# Patient Record
Sex: Male | Born: 2000 | Race: Black or African American | Hispanic: No | Marital: Single
Health system: Midwestern US, Community
[De-identification: ages and names within clinical notes are randomized; demographics above are authoritative.]

## PROBLEM LIST (undated history)

## (undated) DIAGNOSIS — R4689 Other symptoms and signs involving appearance and behavior: Secondary | ICD-10-CM

## (undated) DIAGNOSIS — E669 Obesity, unspecified: Secondary | ICD-10-CM

## (undated) DIAGNOSIS — M2142 Flat foot [pes planus] (acquired), left foot: Secondary | ICD-10-CM

## (undated) DIAGNOSIS — F419 Anxiety disorder, unspecified: Secondary | ICD-10-CM

## (undated) DIAGNOSIS — H5210 Myopia, unspecified eye: Secondary | ICD-10-CM

## (undated) DIAGNOSIS — N3944 Nocturnal enuresis: Secondary | ICD-10-CM

## (undated) DIAGNOSIS — R7303 Prediabetes: Secondary | ICD-10-CM

## (undated) DIAGNOSIS — T148XXA Other injury of unspecified body region, initial encounter: Secondary | ICD-10-CM

## (undated) DIAGNOSIS — J309 Allergic rhinitis, unspecified: Secondary | ICD-10-CM

## (undated) DIAGNOSIS — M2141 Flat foot [pes planus] (acquired), right foot: Secondary | ICD-10-CM

## (undated) DIAGNOSIS — S92403A Displaced unspecified fracture of unspecified great toe, initial encounter for closed fracture: Secondary | ICD-10-CM

## (undated) HISTORY — DX: Flat foot (pes planus) (acquired), right foot: M21.41

## (undated) HISTORY — DX: Prediabetes: R73.03

## (undated) HISTORY — DX: Nocturnal enuresis: N39.44

## (undated) HISTORY — DX: Myopia, unspecified eye: H52.10

## (undated) HISTORY — DX: Allergic rhinitis, unspecified: J30.9

## (undated) HISTORY — DX: Flat foot (pes planus) (acquired), left foot: M21.42

## (undated) HISTORY — DX: Other injury of unspecified body region, initial encounter: T14.8XXA

## (undated) HISTORY — DX: Displaced unspecified fracture of unspecified great toe, initial encounter for closed fracture: S92.403A

## (undated) HISTORY — DX: Obesity, unspecified: E66.9

## (undated) HISTORY — DX: Other symptoms and signs involving appearance and behavior: R46.89

---
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
## 2019-07-03 ENCOUNTER — Other Ambulatory Visit: Payer: Self-pay

## 2019-07-03 DIAGNOSIS — Z20822 Contact with and (suspected) exposure to covid-19: Secondary | ICD-10-CM

## 2019-07-06 LAB — NOVEL CORONAVIRUS, NAA: SARS-CoV-2, NAA: NOT DETECTED

## 2019-08-07 ENCOUNTER — Other Ambulatory Visit: Payer: Self-pay

## 2019-08-07 DIAGNOSIS — Z20822 Contact with and (suspected) exposure to covid-19: Secondary | ICD-10-CM

## 2019-08-08 LAB — NOVEL CORONAVIRUS, NAA: SARS-CoV-2, NAA: DETECTED — AB

## 2019-08-09 ENCOUNTER — Telehealth: Payer: Self-pay | Admitting: Family Medicine

## 2019-08-09 NOTE — Telephone Encounter (Signed)
Patient called about Positive Covid test from 08/07/2019 testing event. The patient has mild upper respiratory symptoms and body aches . Symptoms are mild at this time. Patient understands the needs to stay in isolation for a total of 10 days from onset of symptom or 14 days total from date of testing if no symptom. Patient knows the health department may be in touch.  I answered all of patient's questions and all concerns addressed.   Nolon Nations  APRN, MSN, FNP-C [facility] [address] [place], [place] [address] [phone]

## 2019-08-14 ENCOUNTER — Other Ambulatory Visit: Payer: Self-pay

## 2019-08-14 DIAGNOSIS — Z20822 Contact with and (suspected) exposure to covid-19: Secondary | ICD-10-CM

## 2019-08-15 LAB — NOVEL CORONAVIRUS, NAA: SARS-CoV-2, NAA: DETECTED — AB

## 2019-08-23 ENCOUNTER — Other Ambulatory Visit: Payer: Self-pay

## 2019-08-23 DIAGNOSIS — Z20822 Contact with and (suspected) exposure to covid-19: Secondary | ICD-10-CM

## 2019-08-25 LAB — NOVEL CORONAVIRUS, NAA: SARS-CoV-2, NAA: NOT DETECTED

## 2020-03-29 ENCOUNTER — Encounter (HOSPITAL_COMMUNITY): Payer: Self-pay | Admitting: *Deleted

## 2020-03-29 ENCOUNTER — Ambulatory Visit (HOSPITAL_COMMUNITY)
Admission: EM | Admit: 2020-03-29 | Discharge: 2020-03-29 | Disposition: A | Discharge status: Home / Self Care | Payer: No Typology Code available for payment source | Attending: Urgent Care | Admitting: Urgent Care

## 2020-03-29 ENCOUNTER — Other Ambulatory Visit: Payer: Self-pay

## 2020-03-29 DIAGNOSIS — R739 Hyperglycemia, unspecified: Secondary | ICD-10-CM | POA: Diagnosis not present

## 2020-03-29 DIAGNOSIS — Z87898 Personal history of other specified conditions: Secondary | ICD-10-CM

## 2020-03-29 DIAGNOSIS — Z833 Family history of diabetes mellitus: Secondary | ICD-10-CM

## 2020-03-29 DIAGNOSIS — R358 Other polyuria: Secondary | ICD-10-CM | POA: Diagnosis not present

## 2020-03-29 LAB — CBG MONITORING, ED: Glucose-Capillary: 305 mg/dL — ABNORMAL HIGH (ref 70–99)

## 2020-03-29 NOTE — Discharge Instructions (Addendum)
For diabetes or elevated blood sugar, please make sure you are avoiding starchy, carbohydrate foods like pasta, breads, pastry, rice, potatoes, desserts, tortillas, wraps, cereals. These foods can elevated your blood sugar. Also, avoid sodas, sweet teas, sugary beverages, fruit juices.  Drinking plain water will be much more helpful, try 64 ounces of water daily.  It is okay to flavor your water naturally by cutting cucumber, lemon, mint or lime, placing it in a picture with water and drinking it over a period of 2 to 3 days as long as it remains refrigerated.    For elevated blood pressure, make sure you are monitoring salt in your diet.  Do not eat restaurant foods and limit processed foods at home, prepare/cook your own foods at home.  Processed foods include things like frozen meals preseasoned meats and dinners, deli meats, canned foods as they are high in sodium/salt.  Make sure your pain attention to sodium labels on foods you by at the grocery store.  For seasoning you can use a brand called Mrs. Dash which includes a lot of salt free seasonings.  Salads - kale, spinach, cabbage, spring mix; use seeds like pumpkin seeds or sunflower seeds, almonds, walnuts or pecans; you can also use 1-2 hard boiled eggs in your salads Fruits - avocadoes, berries (blueberries, raspberries, blackberries), apples, oranges, pomegranate, pear; avoid eating bananas, grapes regularly Vegetables - aspargus, cauliflower, broccoli, green beans, brussel spouts, bell peppers; stay away from starchy vegetables like potatoes, carrots, peas  Regarding meat it is better to eat lean meats and limit your red meat including pork to once a week.  Wild caught fish, chicken breast are good options as they tend to be leaner sources of good protein. Pay attention to the labels that document how much sodium/salt is in your meat serving and limit this when possible.   DO NOT EAT ANY FOODS ON THIS LIST THAT YOU ARE ALLERGIC TO.

## 2020-03-29 NOTE — ED Provider Notes (Signed)
[facility]   MRN: [id number] DOB: 02/21/2001  Subjective:   Alexander Valdez is a 19 y.o. male presenting for blood sugar check.  This was done at work by the nursing site.  Patient has had abdominal bloating, urinary frequency and general fatigue, increased thirst since last Friday. Family hx of diabetes of type 2. No type 1.  Denies confusion, headache, chest pain, shortness of breath, belly pain, hematuria.  Has a history of prediabetes.  Has never had to take medication for high blood sugar.  No current facility-administered medications for this encounter.  Current Outpatient Medications:  .  cetirizine (ZYRTEC) 10 MG tablet, TAKE ONE (1) TABLET BY MOUTH EVERY DAY, Disp: 30 tablet, Rfl: 5 .  FLUoxetine (PROZAC) 20 MG capsule, Take 2 capsules (40 mg total) by mouth daily., Disp: 60 capsule, Rfl: 0 .  fluticasone (FLONASE) 50 MCG/ACT nasal spray, PLACE 2 SPRAYS INTO BOTH NOSTRILS DAILY, Disp: 16 g, Rfl: 5 .  albuterol (PROVENTIL HFA;VENTOLIN HFA) 108 (90 Base) MCG/ACT inhaler, Inhale 2 puffs into the lungs every 4 (four) hours as needed for wheezing (or cough)., Disp: 1 Inhaler, Rfl: 0 .  Multiple Vitamin (MULTIVITAMIN WITH MINERALS) TABS tablet, Take 1 tablet by mouth daily., Disp: , Rfl:    Allergies  Allergen Reactions  . Shellfish Allergy     Past Medical History:  Diagnosis Date  . 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 Allergic rhinitis 19 yr old onset  . Anxiety   . Asthma 2007   2014 mild, intermittant, last Qvar 2011, last wheeze in clinic 2010, first wheeze at 19 year old  . Fracture of bone 05/2008   avulsion fracture left elbow  . Fractured great toe 12/2008   right phalanx  . Myopia   . Obesity 2012  . Pes planus (flat feet) 2010 dxn   10/2011: saw Dr. Charlett Blake, had orthotic  . Pre-diabetes 03/2012   HgA1C 5.9 2013  . Primary nocturnal enuresis 2012     History reviewed. No pertinent surgical  history.  Family History  Problem Relation Age of Onset  . Obesity Mother   . Depression Mother   . Obesity Sister     Social History   Tobacco Use  . Smoking status: Never Smoker  . Smokeless tobacco: Never Used  Substance Use Topics  . Alcohol use: No  . Drug use: No    ROS   Objective:   Vitals: Pulse 79   Temp 98.9 F (37.2 C) (Oral)   Resp 17   SpO2 100%   Physical Exam Constitutional:      General: He is not in acute distress.    Appearance: Normal appearance. He is well-developed. He is obese. He is not ill-appearing, toxic-appearing or diaphoretic.  HENT:     Head: Normocephalic and atraumatic.     Right Ear: External ear normal.     Left Ear: External ear normal.     Nose: Nose normal.     Mouth/Throat:     Mouth: Mucous membranes are moist.     Pharynx: Oropharynx is clear.  Eyes:     General: No scleral icterus.    Extraocular Movements: Extraocular movements intact.     Pupils: Pupils are equal, round, and reactive to light.  Cardiovascular:     Rate and Rhythm: Normal rate and regular rhythm.     Heart sounds: Normal heart sounds. No murmur heard.  No friction rub. No  gallop.   Pulmonary:     Effort: Pulmonary effort is normal. No respiratory distress.     Breath sounds: Normal breath sounds. No stridor. No wheezing, rhonchi or rales.  Neurological:     Mental Status: He is alert and oriented to person, place, and time.  Psychiatric:        Mood and Affect: Mood normal.        Behavior: Behavior normal.        Thought Content: Thought content normal.     Results for orders placed or performed during the hospital encounter of 03/29/20 (from the past 24 hour(s))  POC CBG monitoring     Status: Abnormal   Collection Time: 03/29/20  4:12 PM  Result Value Ref Range   Glucose-Capillary 305 (H) 70 - 99 mg/dL   Comment 1 Document in Chart     Assessment and Plan :   PDMP not reviewed this encounter.  1. Hyperglycemia   2. History of  prediabetes   3. Family history of diabetes mellitus     Patient states that he does not want to take medication at this time.  Would like to make dietary modifications.  Does not have a PCP established.  Was previously seen by his pediatrician but has not seen them since he became an adult.  Recommended establishing care with [facility] for new PCP and recheck on his blood sugar.  In the meantime reviewed diabetic friendly diet. Counseled patient on potential for adverse effects with medications prescribed/recommended today, ER and return-to-clinic precautions discussed, patient verbalized understanding.    Wallis Bamberg, [place] 03/29/20 1644

## 2020-03-29 NOTE — ED Triage Notes (Addendum)
Patient reports polyuria and bloating since Friday. No history of diabetes. Reports increased thirst. Reports fatigue. Denies dizziness.   States the RN at work did CBG on site and was HIGH.

## 2020-03-30 ENCOUNTER — Telehealth: Payer: Self-pay

## 2020-03-30 NOTE — Telephone Encounter (Signed)
Received call from mom that pt had episode of hyperglycemia yesterday, went to urgent care, BG was in 400s, they got it to come down to 300s, it is in 300s again this AM, pt went to work but she would like him to be seen ASAP to address matter. 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 Patient needs to transition to adult PCP soon.

## 2020-03-31 ENCOUNTER — Ambulatory Visit: Payer: Self-pay | Admitting: Pediatrics

## 2020-03-31 ENCOUNTER — Other Ambulatory Visit: Payer: Self-pay

## 2020-03-31 ENCOUNTER — Other Ambulatory Visit: Payer: No Typology Code available for payment source

## 2020-03-31 VITALS — Wt 353.2 lb

## 2020-03-31 DIAGNOSIS — Z20822 Contact with and (suspected) exposure to covid-19: Secondary | ICD-10-CM

## 2020-03-31 DIAGNOSIS — R739 Hyperglycemia, unspecified: Secondary | ICD-10-CM

## 2020-03-31 LAB — POCT GLYCOSYLATED HEMOGLOBIN (HGB A1C): Hemoglobin A1C: 10.6 % — AB (ref 4.0–5.6)

## 2020-03-31 LAB — POCT GLUCOSE (DEVICE FOR HOME USE): Glucose Fasting, POC: 342 mg/dL — AB (ref 70–99)

## 2020-03-31 MED ORDER — METFORMIN HCL 500 MG PO TABS: 500.0000 mg | ORAL_TABLET | Freq: Every evening | ORAL | 1 refills | Status: DC

## 2020-03-31 MED ORDER — METFORMIN HCL 500 MG PO TABS: 500.0000 mg | ORAL_TABLET | Freq: Every day | ORAL | 1 refills | Status: DC

## 2020-03-31 NOTE — Patient Instructions (Addendum)
Thank you for allowing [place] to see Alexander Valdez today in clinic. You will need to go over to the [facility] to make an appointment to be seen. We have also sent over a prescription for Metformin for you to start in the mean time. Thank you!  Hyperglycemia Hyperglycemia occurs when the level of sugar (glucose) in the blood is too high. Glucose is a type of sugar that provides the body's main source of energy. Certain hormones (insulin and glucagon) control the level of glucose in the blood. Insulin lowers blood glucose, and glucagon increases blood glucose. Hyperglycemia can result from having too little insulin in the bloodstream, or from the body not responding normally to insulin. Hyperglycemia occurs most often in people who have diabetes (diabetes mellitus), but it can happen in people who do not have diabetes. It can develop quickly, and it can be life-threatening if it causes you to become severely dehydrated (diabetic ketoacidosis or hyperglycemic hyperosmolar state). Severe hyperglycemia is a medical emergency. What are the causes? If you have diabetes, hyperglycemia may be caused by:  Diabetes medicine.  Medicines that increase blood glucose or affect your diabetes control.  Not eating enough, or not eating often enough.  Changes in physical activity level.  Being sick or having an infection. If you have prediabetes or undiagnosed diabetes:  Hyperglycemia may be caused by those conditions. If you do not have diabetes, hyperglycemia may be caused by:  Certain medicines, including steroid medicines, beta-blockers, epinephrine, and thiazide diuretics.  Stress.  Serious illness.  Surgery.  Diseases of the pancreas.  Infection. What increases the risk? Hyperglycemia is more likely to develop in people who have risk factors for diabetes, such as:  Having a family member with diabetes.  Having a gene for type 1 diabetes that is passed from parent to child  (inherited).  Living in an area with cold weather conditions.  Exposure to certain viruses.  Certain conditions in which the body's disease-fighting (immune) system attacks itself (autoimmune disorders).  Being overweight or obese.  Having an inactive (sedentary) lifestyle.  Having been diagnosed with insulin resistance.  Having a history of prediabetes, gestational diabetes, or polycystic ovarian syndrome (PCOS).  Being of American-Indian, African-American, Hispanic/Latino, or Asian/Pacific Islander descent. What are the signs or symptoms? Hyperglycemia may not cause any symptoms. If you do have symptoms, they may include early warning signs, such as:  Increased thirst.  Hunger.  Feeling very tired.  Needing to urinate more often than usual.  Blurry vision. Other symptoms may develop if hyperglycemia gets worse, such as:  Dry mouth.  Loss of appetite.  Fruity-smelling breath.  Weakness.  Unexpected or rapid weight gain or weight loss.  Tingling or numbness in the hands or feet.  Headache.  Skin that does not quickly return to normal after being lightly pinched and released (poor skin turgor).  Abdominal pain.  Cuts or bruises that are slow to heal. How is this diagnosed? Hyperglycemia is diagnosed with a blood test to measure your blood glucose level. This blood test is usually done while you are having symptoms. Your health care provider may also do a physical exam and review your medical history. You may have more tests to determine the cause of your hyperglycemia, such as:  A fasting blood glucose (FBG) test. You will not be allowed to eat (you will fast) for at least 8 hours before a blood sample is taken.  An A1c (hemoglobin A1c) blood test. This provides information about blood glucose control  over the previous 2-3 months.  An oral glucose tolerance test (OGTT). This measures your blood glucose at two times: ? After fasting. This is your baseline  blood glucose level. ? Two hours after drinking a beverage that contains glucose. How is this treated? Treatment depends on the cause of your hyperglycemia. Treatment may include:  Taking medicine to regulate your blood glucose levels. If you take insulin or other diabetes medicines, your medicine or dosage may be adjusted.  Lifestyle changes, such as exercising more, eating healthier foods, or losing weight.  Treating an illness or infection, if this caused your hyperglycemia.  Checking your blood glucose more often.  Stopping or reducing steroid medicines, if these caused your hyperglycemia. If your hyperglycemia becomes severe and it results in hyperglycemic hyperosmolar state, you must be hospitalized and given IV fluids. Follow these instructions at home:  General instructions  Take over-the-counter and prescription medicines only as told by your health care provider.  Do not use any products that contain nicotine or tobacco, such as cigarettes and e-cigarettes. If you need help quitting, ask your health care provider.  Limit alcohol intake to no more than 1 drink per day for nonpregnant women and 2 drinks per day for men. One drink equals 12 oz of beer, 5 oz of wine, or 1 oz of hard liquor.  Learn to manage stress. If you need help with this, ask your health care provider.  Keep all follow-up visits as told by your health care provider. This is important. Eating and drinking   Maintain a healthy weight.  Exercise regularly, as directed by your health care provider.  Stay hydrated, especially when you exercise, get sick, or spend time in hot temperatures.  Eat healthy foods, such as: ? Lean proteins. ? Complex carbohydrates. ? Fresh fruits and vegetables. ? Low-fat dairy products. ? Healthy fats.  Drink enough fluid to keep your urine clear or pale yellow. If you have diabetes:  Make sure you know the symptoms of hyperglycemia.  Follow your diabetes management  plan, as told by your health care provider. Make sure you: ? Take your insulin and medicines as directed. ? Follow your exercise plan. ? Follow your meal plan. Eat on time, and do not skip meals. ? Check your blood glucose as often as directed. Make sure to check your blood glucose before and after exercise. If you exercise longer or in a different way than usual, check your blood glucose more often. ? Follow your sick day plan whenever you cannot eat or drink normally. Make this plan in advance with your health care provider.  Share your diabetes management plan with people in your workplace, school, and household.  Check your urine for ketones when you are ill and as told by your health care provider.  Carry a medical alert card or wear medical alert jewelry. Contact a health care provider if:  Your blood glucose is at or above 240 mg/dL (40.1 mmol/L) for 2 days in a row.  You have problems keeping your blood glucose in your target range.  You have frequent episodes of hyperglycemia. Get help right away if:  You have difficulty breathing.  You have a change in how you think, feel, or act (mental status).  You have nausea or vomiting that does not go away. These symptoms may represent a serious problem that is an emergency. Do not wait to see if the symptoms will go away. Get medical help right away. Call your local emergency services (911  in the [place] Do not drive yourself to the hospital. Summary  Hyperglycemia occurs when the level of sugar (glucose) in the blood is too high.  Hyperglycemia is diagnosed with a blood test to measure your blood glucose level. This blood test is usually done while you are having symptoms. Your health care provider may also do a physical exam and review your medical history.  If you have diabetes, follow your diabetes management plan as told by your health care provider.  Contact your health care provider if you have problems keeping your blood  glucose in your target range. This information is not intended to replace advice given to you by your health care provider. Make sure you discuss any questions you have with your health care provider. Document Revised: 04/01/2016 Document Reviewed: 04/01/2016 Elsevier Patient Education  2020 [employer].

## 2020-03-31 NOTE — Progress Notes (Signed)
Subjective:     Alexander Valdez, is a 19 y.o. male with past medical history of mild intermittent asthma and allergic rhinitis presenting for follow-up after urgent care visit on 9/1 for hyperglycemia.    History provider by patient No interpreter necessary.  Chief Complaint  Patient presents with  . Follow-up    found to have elevated BS at urgent care visit. transition to fam med. has had covid shots.     HPI:  Patient was seen in urgent care on 9/1 for blood sugar check. He was having abdominal bloating, urinary frequency and fatigue along with increased thirst for 5 days. His point of care glucose was checked at the nursing station and found to be 305. Provider at urgent care stated that patient was not interested in taking medication at this time and encouraged him to make dietary modifications and to establish with new PCP for recheck on his blood sugar.   Today, Cartel reports that his blood sugar was tested at work and was reading high repeatedly on glucometer, made go to urgent care and at urgent care 305. Been years since seen doctor. Still having increased thirst and fatigue.Only drinking juice for the past week. Was having increased urine output for a week, now feels like back to normal. Denies enuresis. Denies blurry vision. Occasionally has headache but not worsening. No congestion, shortness of breath, cough, fever, nausea, vomiting, diarrhea.   Philbert does report that both his blood sugar at work and his blood sugar at urgent care were fasting. He did have an A1c in 2019 that was 5.9 but has not had one since then.   He has a strong family history of T2DM in sister, mother and maternal grandmother.   He has been vaccinated for COVID.   Review of Systems  Constitutional: Positive for fatigue. Negative for diaphoresis and fever.  HENT: Negative for congestion, rhinorrhea, sinus pain and sore throat.   Eyes: Negative for photophobia and visual disturbance.    Respiratory: Negative for cough, shortness of breath and wheezing.   Cardiovascular: Negative for chest pain and palpitations.  Gastrointestinal: Negative for abdominal pain, diarrhea, nausea and vomiting.  Endocrine: Positive for polydipsia and polyuria. Negative for cold intolerance and heat intolerance.  Genitourinary: Negative for decreased urine volume, difficulty urinating, enuresis and urgency.  Skin: Negative for rash.  Neurological: Negative for weakness, light-headedness and headaches.     Patient's history was reviewed and updated as appropriate: allergies, current medications, past family history, past medical history, past social history, past surgical history and problem list.      Objective:     Wt (!) 353 lb 3.2 oz (160.2 kg)   BMI 49.26 kg/m   Gen: Well appearing obese male, no acute distress.  HEENT: Normocephalic, atraumatic. Moist mucous membranes. PERRLA. EOMI. Oropharynx without erythema or exudates.  Neck: Supple without lymphadenopathy.  CV: RRR no murmurs rubs or gallops. Cap refill <2 seconds. Pulses 2+ bilaterally. No peripheral edema.  Lungs: Clear to auscultation bilaterally. No wheezing or crackles. No increased work of breathing.  Abdomen: Soft, non tender non distended. Normoactive bowel sounds. No hepatosplenomegaly or masses.  Extremities: Warm and well perfused.  Skin: No rashes, bruises or lesions.     Assessment & Plan:   [name] is a 19 y.o male with past medical history of asthma and allergic rhinitis who has not been seen in three years presenting today for urgent care follow-up of hyperglycemia.   1. Undiagnosed hyperglycemia, likely T2DM- Patient  was seen at urgent care [facility] on 9/1 with hyperglycemia to 305. Today in clinic POC glucose 342 and POC A1C 10.6 likely supporting a diagnosis of T2DM given patient's body habitus, age, strong family history of T2DM. Well plan to start low dose Metformin 500 mg every evening. NO  severe symptoms suggestive of ketoacidosis.  Also encouraged patient to continue lifestyle modifications with exercise and healthy eating and cutting out sugary foods like soda. Given patient's age he will need to transition care to family medicine clinic. Provided patient with instructions of how to get to clinic and make an appointment following his appointment today. He will require further lab testing including lipid panel, UA. fasting glucose, definitive HbA1c,  and medication titration and screening upon establishing care at family medicine clinic.  -POC Glucose in clinic 342 -POC HbA1c 10.6  -continue lifestyle modifications with daily exercise and diet of fruits vegetables and cutting out soda  -Metformin 500 mg every evening   Supportive care and return precautions reviewed.  No follow-ups on file.  Genia Plants, MD [facility], PGY1   I saw and evaluated the patient, performing the key elements of the service. I developed the management plan that is described in the resident's note, and I agree with the content.     Henrietta Hoover, MD                  04/01/2020, 7:55 AM

## 2020-04-01 LAB — NOVEL CORONAVIRUS, NAA: SARS-CoV-2, NAA: NOT DETECTED

## 2020-04-10 ENCOUNTER — Telehealth: Payer: Self-pay | Admitting: *Deleted

## 2020-04-10 DIAGNOSIS — E1165 Type 2 diabetes mellitus with hyperglycemia: Secondary | ICD-10-CM

## 2020-04-10 MED ORDER — METFORMIN HCL 500 MG PO TABS: 500.0000 mg | ORAL_TABLET | Freq: Two times a day (BID) | ORAL | 1 refills | Status: DC

## 2020-04-10 NOTE — Telephone Encounter (Signed)
Agree with report of conversation and advice given.  Please do increase to Metformin 500 mg BID Will send refills during this transition time.

## 2020-04-10 NOTE — Addendum Note (Signed)
Addended by: Theadore Nan on: 04/10/2020 03:32 PM   Modules accepted: Orders

## 2020-04-10 NOTE — Telephone Encounter (Signed)
Mom called earlier this afternoon and left a message stating that pt is having high blood sugars 300-500 even on Metformin 500 mg. Than patient called with the same concern. Spoke with Dr. Kathlene November who reccommended to increase Metformin to 500 mg BID instead to QD. And that patient needs to transition to an adult doctor. Called number provided, pt answered and he said he feels fine it's just the numbers are so high. Went over Dr. Lona Kettle recommendation with him, and that Dr. Kathlene November is going to send him more refills for metformin. Patient stated that he contacted family medicine and was told that they will request his record and should get back with him in about 10 day.  Routing to Dr. Kathlene November.

## 2020-04-17 ENCOUNTER — Telehealth: Payer: Self-pay

## 2020-04-17 NOTE — Telephone Encounter (Signed)
Pt called and said he was still having high fasting blood sugars despite being on metformin. He does have an appt w family medicine in October. Advised that he go to urgent care for hyperglycemia today. He stated understanding and thanks.

## 2020-05-04 ENCOUNTER — Ambulatory Visit
Admission: RE | Admit: 2020-05-04 | Discharge: 2020-05-04 | Disposition: A | Discharge status: Home / Self Care | Payer: No Typology Code available for payment source | Source: Ambulatory Visit | Attending: Emergency Medicine | Admitting: Emergency Medicine

## 2020-05-04 ENCOUNTER — Other Ambulatory Visit: Payer: Self-pay

## 2020-05-04 VITALS — BP 127/78 | HR 76 | Temp 97.9°F | Resp 20

## 2020-05-04 DIAGNOSIS — B3749 Other urogenital candidiasis: Secondary | ICD-10-CM | POA: Diagnosis present

## 2020-05-04 DIAGNOSIS — E1165 Type 2 diabetes mellitus with hyperglycemia: Secondary | ICD-10-CM | POA: Diagnosis not present

## 2020-05-04 LAB — POCT FASTING CBG KUC MANUAL ENTRY: POCT Glucose (KUC): 325 mg/dL — AB (ref 70–99)

## 2020-05-04 LAB — POCT URINALYSIS DIP (MANUAL ENTRY)
Bilirubin, UA: NEGATIVE
Glucose, UA: 500 mg/dL — AB
Leukocytes, UA: NEGATIVE
Nitrite, UA: NEGATIVE
Protein Ur, POC: NEGATIVE mg/dL
Spec Grav, UA: 1.015 (ref 1.010–1.025)
Urobilinogen, UA: 0.2 E.U./dL
pH, UA: 6 (ref 5.0–8.0)

## 2020-05-04 LAB — COMPREHENSIVE METABOLIC PANEL
ALT: 24 IU/L (ref 0–44)
AST: 18 IU/L (ref 0–40)
Albumin/Globulin Ratio: 1.8 (ref 1.2–2.2)
Albumin: 4.2 g/dL (ref 4.1–5.2)
Alkaline Phosphatase: 111 IU/L (ref 44–121)
BUN/Creatinine Ratio: 19 (ref 9–20)
BUN: 12 mg/dL (ref 6–20)
Bilirubin Total: 0.3 mg/dL (ref 0.0–1.2)
CO2: 23 mmol/L (ref 20–29)
Calcium: 9.6 mg/dL (ref 8.7–10.2)
Chloride: 104 mmol/L (ref 96–106)
Creatinine, Ser: 0.64 mg/dL — ABNORMAL LOW (ref 0.76–1.27)
GFR calc Af Amer: 162 mL/min/{1.73_m2} (ref >59)
GFR calc non Af Amer: 140 mL/min/{1.73_m2} (ref >59)
Globulin, Total: 2.3 g/dL (ref 1.5–4.5)
Glucose: 372 mg/dL — ABNORMAL HIGH (ref 65–99)
Potassium: 4.4 mmol/L (ref 3.5–5.2)
Sodium: 136 mmol/L (ref 134–144)
Total Protein: 6.5 g/dL (ref 6.0–8.5)

## 2020-05-04 MED ORDER — FLUCONAZOLE 150 MG PO TABS: 150.0000 mg | ORAL_TABLET | Freq: Once | ORAL | 0 refills | Status: AC

## 2020-05-04 MED ORDER — METFORMIN HCL 1000 MG PO TABS: 1000.0000 mg | ORAL_TABLET | Freq: Two times a day (BID) | ORAL | 0 refills | Status: AC

## 2020-05-04 MED ORDER — NYSTATIN 100000 UNIT/GM EX CREA: TOPICAL_CREAM | CUTANEOUS | 0 refills | Status: AC

## 2020-05-04 NOTE — ED Triage Notes (Signed)
Pt reports white spots on penis and excessive itching since Sunday. Pt is aox4 and ambulatory.

## 2020-05-04 NOTE — ED Provider Notes (Signed)
[name]    CSN: [id number] Arrival date & time: 05/04/20  0859      History   Chief Complaint Chief Complaint  Patient presents with  . yeast infection    since monday    HPI Alexander Valdez is a 19 y.o. male presenting today for evaluation of penile itching.  Patient reports on Sunday, for the past 5 days has had itching and discomfort around the glans of his penis.  He has noticed some small white spots to this area as well.  He denies any dysuria, penile discharge.  Has had urinary frequency.  Denies testicle pain or swelling.  Denies fevers nausea or vomiting.  Is sexually active, but denies specific concerns for STDs.  Recently diagnosed with diabetes, on Metformin 500 twice daily.  Has been checking his sugars at home and continue to be elevated around 200-300.  Has plans to establish care with primary care on 10/11.  HPI  Past Medical History:  Diagnosis Date  . 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 Allergic rhinitis 19 yr old onset  . Anxiety   . Asthma 2007   2014 mild, intermittant, last Qvar 2011, last wheeze in clinic 2010, first wheeze at 19 year old  . Fracture of bone 05/2008   avulsion fracture left elbow  . Fractured great toe 12/2008   right phalanx  . Myopia   . Obesity 2012  . Pes planus (flat feet) 2010 dxn   10/2011: saw Dr. Charlett Blake, had orthotic  . Pre-diabetes 03/2012   HgA1C 5.9 2013  . Primary nocturnal enuresis 2012    Patient Active Problem List   Diagnosis Date Noted  . 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 Obese 05/26/2014  . Pre-diabetes   . Adolescent behavior problem   . Allergy   . Mild intermittent asthma     History reviewed. No pertinent surgical history.     Home Medications    Prior to Admission medications   Medication Sig Start Date End Date Taking? Authorizing Provider  albuterol (PROVENTIL HFA;VENTOLIN HFA) 108 (90 Base) MCG/ACT inhaler Inhale 2  puffs into the lungs every 4 (four) hours as needed for wheezing (or cough). 07/15/17  Yes Theadore Nan, MD  cetirizine (ZYRTEC) 10 MG tablet TAKE ONE (1) TABLET BY MOUTH EVERY DAY 07/15/17  Yes Theadore Nan, MD  FLUoxetine (PROZAC) 20 MG capsule Take 2 capsules (40 mg total) by mouth daily. 06/02/15  Yes Bacigalupo, Marzella Schlein, MD  fluticasone Aleda Grana) 50 MCG/ACT nasal spray PLACE 2 SPRAYS INTO BOTH NOSTRILS DAILY 07/15/17  Yes Theadore Nan, MD  Multiple Vitamin (MULTIVITAMIN WITH MINERALS) TABS tablet Take 1 tablet by mouth daily.   Yes [provider]  fluconazole (DIFLUCAN) 150 MG tablet Take 1 tablet (150 mg total) by mouth once for 1 dose. Repeat in 72 hours 05/04/20 05/04/20  Oyinkansola Truax C, PA-C  metFORMIN (GLUCOPHAGE) 1000 MG tablet Take 1 tablet (1,000 mg total) by mouth in the morning and at bedtime. 05/04/20   Wannetta Langland C, PA-C  nystatin cream (MYCOSTATIN) Apply to affected area 2 times daily 05/04/20   Nyquan Selbe, Lydia C, PA-C    Family History Family History  Problem Relation Age of Onset  . Obesity Mother   . Depression Mother   . Obesity Sister     Social History Social History   Tobacco Use  . Smoking status: Never Smoker  . Smokeless tobacco: Never Used  Vaping  Use  . Vaping Use: Never used  Substance Use Topics  . Alcohol use: No  . Drug use: No     Allergies   Shellfish allergy   Review of Systems Review of Systems  Constitutional: Negative for fever.  HENT: Negative for sore throat.   Respiratory: Negative for shortness of breath.   Cardiovascular: Negative for chest pain.  Gastrointestinal: Negative for abdominal pain, nausea and vomiting.  Genitourinary: Positive for frequency. Negative for difficulty urinating, discharge, dysuria, penile pain, penile swelling, scrotal swelling and testicular pain.  Skin: Negative for rash.  Neurological: Negative for dizziness, light-headedness and headaches.     Physical Exam Triage  Vital Signs ED Triage Vitals  Enc Vitals Group     BP 05/04/20 0934 127/78     Pulse Rate 05/04/20 0934 76     Resp 05/04/20 0934 20     Temp 05/04/20 0934 97.9 F (36.6 C)     Temp src --      SpO2 05/04/20 0934 97 %     Weight --      Height --      Head Circumference --      Peak Flow --      Pain Score 05/04/20 0938 0     Pain Loc --      Pain Edu? --      Excl. in GC? --    No data found.  Updated Vital Signs BP 127/78 (BP Location: Right Arm)   Pulse 76   Temp 97.9 F (36.6 C)   Resp 20   SpO2 97%   Visual Acuity Right Eye Distance:   Left Eye Distance:   Bilateral Distance:    Right Eye Near:   Left Eye Near:    Bilateral Near:     Physical Exam Vitals and nursing note reviewed.  Constitutional:      Appearance: He is well-developed.     Comments: No acute distress  HENT:     Head: Normocephalic and atraumatic.     Nose: Nose normal.  Eyes:     Conjunctiva/sclera: Conjunctivae normal.  Cardiovascular:     Rate and Rhythm: Normal rate.  Pulmonary:     Effort: Pulmonary effort is normal. No respiratory distress.  Abdominal:     General: There is no distension.  Genitourinary:    Comments: Circumcised, mild erythema noted around remote glans, 2 small white dots noted near frenulum, no urethral erythema or discharge noted Musculoskeletal:        General: Normal range of motion.     Cervical back: Neck supple.  Skin:    General: Skin is warm and dry.  Neurological:     Mental Status: He is alert and oriented to person, place, and time.      UC Treatments / Results  Labs (all labs ordered are listed, but only abnormal results are displayed) Labs Reviewed  POCT URINALYSIS DIP (MANUAL ENTRY) - Abnormal; Notable for the following components:      Result Value   Glucose, UA =500 (*)    Ketones, POC UA moderate (40) (*)    Blood, UA trace-intact (*)    All other components within normal limits  POCT FASTING CBG [facility] MANUAL ENTRY - Abnormal;  Notable for the following components:   POCT Glucose ([facility]) 325 (*)    All other components within normal limits  COMPREHENSIVE METABOLIC PANEL  CYTOLOGY, (ORAL, ANAL, URETHRAL) ANCILLARY ONLY    EKG   Radiology No results  found.  Procedures Procedures (including critical care time)  Medications Ordered in UC Medications - No data to display  Initial Impression / Assessment and Plan / UC Course  I have reviewed the triage vital signs and the nursing notes.  Pertinent labs & imaging results that were available during my care of the patient were reviewed by me and considered in my medical decision making (see chart for details).     1.  Treating for yeast dermatitis, placing on Diflucan orally as well as nystatin topically.  Discussed recommendations, suspect likely secondary to high glucose in urine.  STD screening pending.  2.  Hyperglycemia-checking CMP to check LFTs and kidney function.  Recommending to increase Metformin to 1000 twice daily as long as CMP normal.  Follow-up with primary care as planned.  Discussed strict return precautions. Patient verbalized understanding and is agreeable with plan.  Final Clinical Impressions(s) / UC Diagnoses   Final diagnoses:  Yeast dermatitis of penis  Type 2 diabetes mellitus with hyperglycemia, without long-term current use of insulin (HCC)     Discharge Instructions     Increase to Metformin 1000 mg twice daily Take 1 tablet of Diflucan today, repeat in 72 hours Nystatin twice daily to area on penis causing itching STD screening pending, we will call if abnormal Keep groin area clean and dry Follow-up with primary care as planned on 10/11    ED Prescriptions    Medication Sig Dispense Auth. Provider   fluconazole (DIFLUCAN) 150 MG tablet Take 1 tablet (150 mg total) by mouth once for 1 dose. Repeat in 72 hours 2 tablet Leocadia Idleman C, PA-C   nystatin cream (MYCOSTATIN) Apply to affected area 2 times daily 30 g  Lalaine Overstreet C, PA-C   metFORMIN (GLUCOPHAGE) 1000 MG tablet Take 1 tablet (1,000 mg total) by mouth in the morning and at bedtime. 60 tablet Zannie Runkle, [place] C, PA-C     PDMP not reviewed this encounter.   Henessy Rohrer, Vaughn C, PA-C 05/04/20 1027

## 2020-05-04 NOTE — Discharge Instructions (Signed)
Increase to Metformin 1000 mg twice daily Take 1 tablet of Diflucan today, repeat in 72 hours Nystatin twice daily to area on penis causing itching STD screening pending, we will call if abnormal Keep groin area clean and dry Follow-up with primary care as planned on 10/11

## 2020-05-05 LAB — CYTOLOGY, (ORAL, ANAL, URETHRAL) ANCILLARY ONLY
Chlamydia: NEGATIVE
Comment: NEGATIVE
Comment: NEGATIVE
Comment: NORMAL
Neisseria Gonorrhea: NEGATIVE
Trichomonas: NEGATIVE

## 2020-05-08 ENCOUNTER — Ambulatory Visit: Payer: No Typology Code available for payment source | Admitting: Family Medicine

## 2020-06-12 ENCOUNTER — Other Ambulatory Visit: Payer: Self-pay

## 2020-06-12 ENCOUNTER — Ambulatory Visit
Admission: RE | Admit: 2020-06-12 | Discharge: 2020-06-12 | Disposition: A | Discharge status: Home / Self Care | Payer: No Typology Code available for payment source | Source: Ambulatory Visit | Attending: Pediatrics | Admitting: Pediatrics

## 2020-06-12 VITALS — BP 119/80 | HR 73 | Temp 98.2°F | Resp 20

## 2020-06-12 DIAGNOSIS — N481 Balanitis: Secondary | ICD-10-CM

## 2020-06-12 NOTE — ED Triage Notes (Signed)
Triaged by provider  

## 2020-06-12 NOTE — ED Provider Notes (Signed)
[name]    CSN: [id number] Arrival date & time: 06/12/20  1844      History   Chief Complaint No chief complaint on file.   HPI Alexander Valdez is a 19 y.o. male.   19 year old male with history of DM comes in for 1 week history of pain and swelling to the foreskin. He was having some difficulty retracting foreskin, but still able to urinate. Started on nystatin cream 3 days ago from prior prescription, doing BID/TID with good relief. States now having residual minimal itching. Denies penile discharge, open wounds. Denies testicle swelling/pain. Denies fevers. Denies urinary changes. Sexually active, 1 male partner, no consistent condom use.   Fasting cbg 100-200s     Past Medical History:  Diagnosis Date  . 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 Allergic rhinitis 19 yr old onset  . Anxiety   . Asthma 2007   2014 mild, intermittant, last Qvar 2011, last wheeze in clinic 2010, first wheeze at 19 year old  . Fracture of bone 05/2008   avulsion fracture left elbow  . Fractured great toe 12/2008   right phalanx  . Myopia   . Obesity 2012  . Pes planus (flat feet) 2010 dxn   10/2011: saw Dr. Charlett Blake, had orthotic  . Pre-diabetes 03/2012   HgA1C 5.9 2013  . Primary nocturnal enuresis 2012    Patient Active Problem List   Diagnosis Date Noted  . 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 Obese 05/26/2014  . Pre-diabetes   . Adolescent behavior problem   . Allergy   . Mild intermittent asthma     History reviewed. No pertinent surgical history.     Home Medications    Prior to Admission medications   Medication Sig Start Date End Date Taking? Authorizing Provider  albuterol (PROVENTIL HFA;VENTOLIN HFA) 108 (90 Base) MCG/ACT inhaler Inhale 2 puffs into the lungs every 4 (four) hours as needed for wheezing (or cough). 07/15/17   Theadore Nan, MD  cetirizine (ZYRTEC) 10 MG tablet TAKE ONE (1)  TABLET BY MOUTH EVERY DAY 07/15/17   Theadore Nan, MD  FLUoxetine (PROZAC) 20 MG capsule Take 2 capsules (40 mg total) by mouth daily. 06/02/15   Erasmo Downer, MD  fluticasone (FLONASE) 50 MCG/ACT nasal spray PLACE 2 SPRAYS INTO BOTH NOSTRILS DAILY 07/15/17   Theadore Nan, MD  metFORMIN (GLUCOPHAGE) 1000 MG tablet Take 1 tablet (1,000 mg total) by mouth in the morning and at bedtime. 05/04/20   Wieters, Hallie C, PA-C  Multiple Vitamin (MULTIVITAMIN WITH MINERALS) TABS tablet Take 1 tablet by mouth daily.    [provider]  nystatin cream (MYCOSTATIN) Apply to affected area 2 times daily 05/04/20   Wieters, [place] C, PA-C    Family History Family History  Problem Relation Age of Onset  . Obesity Mother   . Depression Mother   . Obesity Sister     Social History Social History   Tobacco Use  . Smoking status: Never Smoker  . Smokeless tobacco: Never Used  Vaping Use  . Vaping Use: Never used  Substance Use Topics  . Alcohol use: No  . Drug use: No     Allergies   Shellfish allergy   Review of Systems Review of Systems  Reason unable to perform ROS: See HPI as above.     Physical Exam Triage Vital Signs ED Triage Vitals [06/12/20 1926]  Enc Vitals Group  BP 119/80     Pulse Rate 73     Resp 20     Temp 98.2 F (36.8 C)     Temp Source Oral     SpO2 98 %     Weight      Height      Head Circumference      Peak Flow      Pain Score      Pain Loc      Pain Edu?      Excl. in GC?    No data found.  Updated Vital Signs BP 119/80 (BP Location: Left Arm)   Pulse 73   Temp 98.2 F (36.8 C) (Oral)   Resp 20   SpO2 98%   Physical Exam Exam conducted with a chaperone present.  Constitutional:      General: He is not in acute distress.    Appearance: Normal appearance. He is well-developed. He is not toxic-appearing or diaphoretic.  HENT:     Head: Normocephalic and atraumatic.  Eyes:     Conjunctiva/sclera: Conjunctivae  normal.     Pupils: Pupils are equal, round, and reactive to light.  Pulmonary:     Effort: Pulmonary effort is normal. No respiratory distress.  Genitourinary:    Penis: Uncircumcised.      Comments: No swelling, erythema, warmth, drainage. Foreskin is retractable. . No testicular tenderness to palpation.  Musculoskeletal:     Cervical back: Normal range of motion and neck supple.  Skin:    General: Skin is warm and dry.  Neurological:     Mental Status: He is alert and oriented to person, place, and time.      UC Treatments / Results  Labs (all labs ordered are listed, but only abnormal results are displayed) Labs Reviewed - No data to display  EKG   Radiology No results found.  Procedures Procedures (including critical care time)  Medications Ordered in UC Medications - No data to display  Initial Impression / Assessment and Plan / UC Course  I have reviewed the triage vital signs and the nursing notes.  Pertinent labs & imaging results that were available during my care of the patient were reviewed by me and considered in my medical decision making (see chart for details).    Continue nystatin until symptoms resolve. Discussed hygiene care. Discussed monitoring CBG. Return precautions given.  Final Clinical Impressions(s) / UC Diagnoses   Final diagnoses:  Balanitis    ED Prescriptions    None     PDMP not reviewed this encounter.   Belinda Fisher, PA-C 06/12/20 1953

## 2020-06-12 NOTE — Discharge Instructions (Addendum)
No alarming signs on the exam. Continue nystatin 2-3 times a day. Continue to keep area clean and dry. Avoid changes in hygiene product. Monitor for increased swelling, redness, warmth, drainage, follow up for reevaluation.

## 2020-06-12 NOTE — ED Provider Notes (Signed)
Formatting of this note is different from the original.  Images from the original note were not included.    [name]    CSN: [id number]  Arrival date & time: 06/12/20  1844        History    Chief Complaint  No chief complaint on file.    HPI  Ryan Alvarez is a 19 y.o. male.     19 year old male with history of DM comes in for 1 week history of pain and swelling to the foreskin. He was having some difficulty retracting foreskin, but still able to urinate. Started on nystatin cream 3 days ago from prior prescription, doing BID/TID with good relief. States now having residual minimal itching. Denies penile discharge, open wounds. Denies testicle swelling/pain. Denies fevers. Denies urinary changes. Sexually active, 1 male partner, no consistent condom use.     Fasting cbg 100-200s    Past Medical History:   Diagnosis Date   ? 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 Allergic rhinitis 19 yr old onset   ? Anxiety    ? Asthma 2007    2014 mild, intermittant, last Qvar 2011, last wheeze in clinic 2010, first wheeze at 19 year old   ? Fracture of bone 05/2008    avulsion fracture left elbow   ? Fractured great toe 12/2008    right phalanx   ? Myopia    ? Obesity 2012   ? Pes planus (flat feet) 2010 dxn    10/2011: saw Dr. Charlett Blake, had orthotic   ? Pre-diabetes 03/2012    HgA1C 5.9 2013   ? Primary nocturnal enuresis 2012     Patient Active Problem List    Diagnosis Date Noted   ? 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 Obese 05/26/2014   ? Pre-diabetes    ? Adolescent behavior problem    ? Allergy    ? Mild intermittent asthma      History reviewed. No pertinent surgical history.    Home Medications      Prior to Admission medications    Medication Sig Start Date End Date Taking? Authorizing Provider   albuterol (PROVENTIL HFA;VENTOLIN HFA) 108 (90 Base) MCG/ACT inhaler Inhale 2 puffs into the lungs every 4 (four) hours as needed for wheezing (or cough).  07/15/17   Theadore Nan, MD   cetirizine (ZYRTEC) 10 MG tablet TAKE ONE (1) TABLET BY MOUTH EVERY DAY 07/15/17   Theadore Nan, MD   FLUoxetine (PROZAC) 20 MG capsule Take 2 capsules (40 mg total) by mouth daily. 06/02/15   Erasmo Downer, MD   fluticasone (FLONASE) 50 MCG/ACT nasal spray PLACE 2 SPRAYS INTO BOTH NOSTRILS DAILY 07/15/17   Theadore Nan, MD   metFORMIN (GLUCOPHAGE) 1000 MG tablet Take 1 tablet (1,000 mg total) by mouth in the morning and at bedtime. 05/04/20   Wieters, Hallie C, PA-C   Multiple Vitamin (MULTIVITAMIN WITH MINERALS) TABS tablet Take 1 tablet by mouth daily.    [provider]   nystatin cream (MYCOSTATIN) Apply to affected area 2 times daily 05/04/20   Wieters, Geraldine C, PA-C     Family History  Family History   Problem Relation Age of Onset   ? Obesity Mother    ? Depression Mother    ? Obesity Sister      Social History  Social History     Tobacco Use   ? Smoking  status: Never Smoker   ? Smokeless tobacco: Never Used   Vaping Use   ? Vaping Use: Never used   Substance Use Topics   ? Alcohol use: No   ? Drug use: No     Allergies    Shellfish allergy    Review of Systems  Review of Systems   Reason unable to perform ROS: See HPI as above.     Physical Exam  Triage Vital Signs  ED Triage Vitals [06/12/20 1926]   Enc Vitals Group      BP 119/80      Pulse Rate 73      Resp 20      Temp 98.2 F (36.8 C)      Temp Source Oral      SpO2 98 %      Weight       Height       Head Circumference       Peak Flow       Pain Score       Pain Loc       Pain Edu?       Excl. in GC?      No data found.    Updated Vital Signs  BP 119/80 (BP Location: Left Arm)   Pulse 73   Temp 98.2 F (36.8 C) (Oral)   Resp 20   SpO2 98%     Physical Exam  Exam conducted with a chaperone present.   Constitutional:       General: He is not in acute distress.     Appearance: Normal appearance. He is well-developed. He is not toxic-appearing or diaphoretic.   HENT:      Head: Normocephalic  and atraumatic.   Eyes:      Conjunctiva/sclera: Conjunctivae normal.      Pupils: Pupils are equal, round, and reactive to light.   Pulmonary:      Effort: Pulmonary effort is normal. No respiratory distress.   Genitourinary:     Penis: Uncircumcised.       Comments: No swelling, erythema, warmth, drainage. Foreskin is retractable. . No testicular tenderness to palpation.   Musculoskeletal:      Cervical back: Normal range of motion and neck supple.   Skin:     General: Skin is warm and dry.   Neurological:      Mental Status: He is alert and oriented to person, place, and time.     UC Treatments / Results   Labs  (all labs ordered are listed, but only abnormal results are displayed)  Labs Reviewed - No data to display    EKG    Radiology  No results found.    Procedures  Procedures (including critical care time)    Medications Ordered in UC  Medications - No data to display    Initial Impression / Assessment and Plan / UC Course   I have reviewed the triage vital signs and the nursing notes.    Pertinent labs & imaging results that were available during my care of the patient were reviewed by me and considered in my medical decision making (see chart for details).      Continue nystatin until symptoms resolve. Discussed hygiene care. Discussed monitoring CBG. Return precautions given.    Final Clinical Impressions(s) / UC Diagnoses     Final diagnoses:   Balanitis     ED Prescriptions     None       PDMP not  reviewed this encounter.    Belinda Fisher, PA-C  06/12/20 1953    Electronically signed by Belinda Fisher, PA-C at 06/12/2020  7:53 PM EST

## 2020-06-12 NOTE — ED Triage Notes (Signed)
Formatting of this note might be different from the original.  Triaged by provider  Electronically signed by Hildred Priest, RN at 06/12/2020  7:39 PM EST

## 2022-07-17 NOTE — ED Notes (Signed)
Formatting of this note might be different from the original.  To [place]  Electronically signed by Haze Rushing, RN at 07/17/2022 12:15 PM EST

## 2022-07-17 NOTE — Unmapped (Signed)
Formatting of this note might be different from the original.  Images from the original note were not included.      [id number] en   Acute Pain, Uncertain Cause   Pain can be caused by many conditions that range from very minor to very serious. In some cases, though, pain comes and goes with no apparent cause.   We were not able to find the exact cause for your pain. At this time there is no sign of any serious illness causing your pain. More tests may be needed to determine the cause. In many cases, pain like this goes away by itself.   Home care   Take any medicines as prescribed. If another medicine was not prescribed for pain, you can take an over-the-counter pain medicine such as ibuprofen or acetaminophen. Use these as directed on the label. Talk with your healthcare provider before taking over-the-counter pain medicine if you have a history of kidney or liver problems or bleeding in the stomach, or have heart disease.     Follow-up care   Follow up with your healthcare provider or our staff as directed.     When to seek medical advice   Call your healthcare provider for any of the following:     Pain changes in pattern     Pain doesn't lessen or gets worse     New symptoms appear     Fever of 100.12F (38C) or higher, or as directed by your healthcare provider     Last Reviewed Date: 07/29/2020    2000-2023 [employer], [place]. All rights reserved. This information is not intended as a substitute for professional medical care. Always follow your healthcare professional's instructions.       Electronically signed by Mattie Marlin, MD at 07/17/2022  2:42 PM EST

## 2022-07-17 NOTE — ED Provider Notes (Signed)
Formatting of this note is different from the original.                            [facility] Emergency Department   Date of Service:  07/17/2022  Chief Complaint   Patient presents with    Male Gu Problem     Pt reports testicular pain, reports pain in both testicles, radiates to groin region. Denies any discharge,swelling. Pt denies frequency, urgency or burning with urination.     Dictation software was used with this chart.  There may be errors.    Patient presents to the emerged from with complaint of testicular pain.  He states it started Sunday for sure maybe even Saturday or Friday of last week.  He states it seems to be kind of in the tubes to go from the testicles up to the body.  He does not have any specific swelling, redness, difficulty urinating, discharge, lumps bumps rashes, or other abnormalities.  He states he was not doing anything unusual when it started.  He was no any heavy lifting twisting he did not fall he was not struck.  He is taken some over-the-counter medications occasionally for pain which is helpful.  It is not gone away so he wanted to get checked out.  He has no other complaints at this time.    History provided by:  Patient  Male Gu Problem  Presenting symptoms: scrotal pain    Presenting symptoms: no dysuria, no penile discharge, no penile pain and no swelling    Context: spontaneously    Relieved by:  OTC medications  Worsened by:  Nothing  Ineffective treatments:  None tried  Associated symptoms: no abdominal pain, no diarrhea, no fever, no genital itching, no genital lesions, no genital rash, no nausea, no penile redness, no penile swelling, no urinary frequency, no urinary retention and no vomiting        No past medical history on file.  No past surgical history on file.  Social History     Socioeconomic History    Marital status: Single     Spouse name: Not on file    Number of children: Not on file    Years of education: Not on file    Highest  education level: Not on file   Occupational History    Not on file   Tobacco Use    Smoking status: Never    Smokeless tobacco: Never   Vaping Use    Vaping Use: Never used   Substance and Sexual Activity    Alcohol use: Not Currently    Drug use: Yes     Frequency: 6.0 times per week     Types: Marijuana    Sexual activity: Not on file     No family history on file.    Review of Systems   Constitutional:  Negative for chills and fever.   HENT:  Negative for congestion.    Eyes:  Negative for discharge and redness.   Respiratory:  Negative for shortness of breath.    Cardiovascular:  Negative for chest pain.   Gastrointestinal:  Negative for abdominal pain, diarrhea, nausea and vomiting.   Genitourinary:  Positive for testicular pain. Negative for dysuria, frequency, penile discharge, penile pain and penile swelling.   Musculoskeletal:  Negative for arthralgias.   Skin:  Negative for rash.   Neurological:  Negative for headaches.   Psychiatric/Behavioral:  Negative  for agitation.        ED Triage Vitals [07/17/22 1132]   Enc Vitals Group      BP 143/87      Pulse 67      Resp 18      Temp 36.7 C (98 F)      Temp src       SpO2 95 %      Weight 149.7 kg (330 lb)      Height 1.829 m (6')      Head Circumference       Peak Flow       Pain Score       Pain Loc       Pain Edu?       Excl. in GC?      I performed a physical exam  Ideal body weight: 77.6 kg (171 lb 1.2 oz)  Adjusted ideal body weight: 106.4 kg (234 lb 10.3 oz)  Physical Exam  Vitals and nursing note reviewed.   Constitutional:       General: He is not in acute distress.     Appearance: He is well-developed. He is not diaphoretic.   HENT:      Head: Normocephalic and atraumatic.      Nose: Nose normal.      Mouth/Throat:      Mouth: Mucous membranes are moist.   Eyes:      General:         Right eye: No discharge.         Left eye: No discharge.   Cardiovascular:      Rate and Rhythm: Normal rate and regular rhythm.      Heart sounds: Normal heart sounds.  No murmur heard.  Pulmonary:      Effort: Pulmonary effort is normal. No respiratory distress.      Breath sounds: Normal breath sounds. No wheezing.   Abdominal:      General: Bowel sounds are normal.      Palpations: Abdomen is soft.      Tenderness: There is no abdominal tenderness. There is no guarding or rebound.   Genitourinary:     Penis: Normal.       Comments: Patient does not have any erythema, edema, or tenderness to palpation of the testicles with the penis at this time.  There is no visible lesions.  Musculoskeletal:      Cervical back: Normal range of motion and neck supple. No rigidity.   Skin:     General: Skin is warm and dry.      Findings: No rash.   Neurological:      Mental Status: He is alert and oriented to person, place, and time.      Deep Tendon Reflexes: Reflexes are normal and symmetric.   Psychiatric:         Behavior: Behavior normal.         Thought Content: Thought content normal.         Judgment: Judgment normal.           ASSESSMENT / MEDICAL DECISION MAKING  I have reviewed the past medical, family, social history sections: including the medications, nursing notes, vital signs, and allergies. Independent review and interpretation of results as below in MDM/ED Course.      Medical Decision Making  From the history and physical examination I have concerns about urinary tract infection, STD, testicular pathology such as epididymitis, orchitis, or torsion.  This is not a  complete list of possible diagnoses.  We will obtain urinalysis, GC chlamydia testing and an ultrasound on this patient.    2:44 PM    Patient's ultrasound and urine test are unremarkable.  I have discharged him home with prescription for Bactrim the names of local physicians in the area so that he can follow-up.  He declines need for an excuse for anything today.    Amount and/or Complexity of Data Reviewed  Labs: ordered. Decision-making details documented in ED Course.  Radiology: ordered. Decision-making details  documented in ED Course.         ED COURSE      ED Course as of 07/17/22 1444   Wed Jul 17, 2022   1320 Patient's urine shows no signs of bladder infection. [BT]   1359 Radiologist does not appreciate any acute abnormalities on the ultrasound other than a small epididymal cyst on the left side. [BT]     ED Course User Index  [BT] Mattie Marlin, MD       Clinical Impressions as of 07/17/22 1444   Pain of male genitalia         Patient Vitals for the past 4 hrs:   BP Temp Pulse Resp SpO2 Height Weight   07/17/22 1132 143/87 36.7 C (98 F) 67 18 95 % 1.829 m (6') 149.7 kg (330 lb)     DIAGNOSTIC/LAB RESULTS REVIEWED:  Results for orders placed or performed during the hospital encounter of 07/17/22 (from the past 24 hour(s))   (PANEL)-URINALYSIS, COMPLETE    Narrative    The following orders were created for panel order (PANEL)-URINALYSIS, COMPLETE.  Procedure                               Abnormality         Status                     ---------                               -----------         ------                     URINALYSIS, COMPLETE[[id number]]         Abnormal            Final result                 Please view results for these tests on the individual orders.   CHLAMYDIA/GC DNA PROBE    Specimen: Urine Voided   Test Value Low-High    GC Amplified Probe Not Detected Not Detected    Chlamydia Amplified Probe Not Detected Not Detected    Narrative    Result(s) obtained by polymerase chain reaction (PCR).   URINALYSIS, COMPLETE   Test Value Low-High    Color, Urine Yellow Colorless, Yellow, or Straw    Clarity, Urine Clear Clear    Specific Gravity, Urine 1.031 (H) 1.005 - 1.030    pH, Urine 6.0 5.0 - 8.0    Hemoglobin, Urine Negative Negative    Bilirubin, Urine Negative Negative    Urobilinogen, Urine Normal Normal    Ketones, Urine Negative Negative    Glucose, Urine Negative Negative    Nitrites, Urine Negative Negative    Leukocyte Esterase, Urine Negative Negative  Protein, Urine Negative Negative     Narrative    Microscopic examination not indicated.   US TESTICULAR    Narrative    Testicular ultrasound 07/17/2022.    INDICATION: Pain and swelling.    TECHNIQUE: Grayscale, color and spectral Doppler images obtained.    FINDINGS:    The right testicle measures 3.2 x 4.6 x 2.6 cm. Normal grayscale. No intratesticular mass. Normal color flow. Epididymis without specific abnormality. Left testicle shows normal grayscale. Left testicle measures 3.1 x 4.5 x 2.4 cm. No mass is seen. Normal color flow. Small left epididymal cyst measures about 3 mm. Otherwise epididymis without specific abnormality.     Impression    IMPRESSION:    No current evidence of torsion or intratesticular mass. 3 mm incidental left epididymal cyst.    Reading Doctor: Andrena Mews  International aid/development worker by: Andrena Mews     DIAGNOSIS:    SNOMED CT(R)   1. Pain of male genitalia  PAIN IN MALE GENITALIA     MEDICATIONS:    Listed below are medications administered during your ED visit:  Medications - No data to display    Listed below are any new, modified or discontinued meds.  The previous medications are the medications which the patient was taking upon presentation and were not altered or changed unless otherwise specified.  Patient's Medications   New Prescriptions    IBUPROFEN (ADVIL,MOTRIN) 600 MG ORAL TABLET    Take 1 Tablet by mouth every 8 hours as needed for Pain.   Previous Medications    No medications on file   Modified Medications    No medications on file   Discontinued Medications    No medications on file     THE PATIENT WAS INSTRUCTED TO FOLLOW UP AS LISTED BELOW:   Physician of your choice    Schedule an appointment as soon as possible for a visit   To establish primary care and for recheck    The Pt is ambulatory, alert, improved and understands all instructions.    Plan: Discussed treatment plan and findings of workup with patient/family. Pt/family voiced understanding of plan and results. Patient/family given  instructions to follow up with primary care provider and clear instructions on when and why to return to emergency department. Pt/family verbalized understanding and is in agreement with discharge plan of treatment and the need to follow up as directed.    ED Provider: Mattie Marlin, MD      Electronically signed by Mattie Marlin, MD at 07/17/2022  2:44 PM EST

## 2022-07-17 NOTE — ED Triage Notes (Signed)
Formatting of this note is different from the original.  Chief Complaint   Patient presents with    Male Gu Problem     Pt reports testicular pain, reports pain in both testicles, radiates to groin region. Denies any discharge,swelling. Pt denies frequency, urgency or burning with urination.     BP 143/87   Pulse 67   Temp 36.7 C (98 F)   Resp 18   Ht 1.829 m (6')   Wt 149.7 kg (330 lb)   SpO2 95%   BMI 44.76 kg/m     Electronically signed by Ronald Lobo, RN at 07/17/2022 11:34 AM EST

## 2022-12-03 LAB — HEMOGLOBIN A1C
Estimated Avg Glucose, External: 119 mg/dL (ref 91–123)
Hemoglobin A1C, External: 5.8 % — ABNORMAL HIGH (ref 4.8–5.6)

## 2023-01-02 DIAGNOSIS — R0789 Other chest pain: Secondary | ICD-10-CM

## 2023-01-02 DIAGNOSIS — S161XXA Strain of muscle, fascia and tendon at neck level, initial encounter: Secondary | ICD-10-CM

## 2023-01-02 NOTE — ED Triage Notes (Signed)
Pt arrives ambulatory to triage  Pt states he has a tooth infection and has been on abx for about a month and has been taking tylenol  Pt still able to tolerate eating and drinking

## 2023-01-02 NOTE — ED Provider Notes (Cosign Needed)
[facility] Care  Emergency Department Treatment Report    Patient: Ryan Alvarez Age: 22 y.o. Sex: male    Date of Birth: [birth year] Admit Date: 01/02/2023 PCP: No primary care provider on file.   MRN: [id number]  CSN: [id number]  Attending: Loa Socks, MD   Room: FCWR/FCWR Time Dictated: 1:28 AM APP: Ahmed Prima, PA-C     Patient seen and examined using standard PPE: surgical mask, gloves.    Chief Complaint   Chief Complaint   Patient presents with    Dental Pain       History of Present Illness   22 y.o. male right-sided neck pain that radiates into the right side of his chest and into his right upper back around his shoulder blade.  Pain exacerbated with movement.  Pain not exacerbated with inspiration.  No shortness of breath.  Pain has been constant and gradually worsening for 1.5 to 2 weeks.  Denies trauma/injury.  Cannot recall any site events.  Tonight the pain in his chest seemed worse and when he told his friends about it they told him he needed to come to the ED to make sure it was nothing serious.    He is concerned the pain may be related to the dental pain he has been having over the past 4 months.  He has been having pain to a tooth along his right lower gums.  He has been treated with several doses of antibiotics over the past month.  Initially was treated with amoxicillin and then clindamycin.  He saw his PCP on Tuesday and the clindamycin was extended.  He has had trouble arranging dental follow-up because no one takes Medicaid.    Review of Systems   Constitutional: No fever, chills  ENT: See above. No URI symptoms  RESP: See above.  No cough  CV: See above.   GI: No nausea, vomiting, diarrhea, abdominal pain  MSK: No leg swelling or pain  SKIN: No rashes  Neuro: No lightheadedness/dizziness, headaches, sensory or motor symptoms    Past Medical/Surgical History   No past medical history on file.  No past surgical history on file.  No history of prediabetes, anxiety,  depression, ADHD.    Social History     Social History     Socioeconomic History    Marital status: Single     Spouse name: Not on file    Number of children: Not on file    Years of education: Not on file    Highest education level: Not on file   Occupational History    Not on file   Tobacco Use    Smoking status: Not on file    Smokeless tobacco: Not on file   Substance and Sexual Activity    Alcohol use: Not on file    Drug use: Not on file    Sexual activity: Not on file   Other Topics Concern    Not on file   Social History Narrative    Not on file     Social Determinants of Health     Financial Resource Strain: Not on file   Food Insecurity: Not on file   Transportation Needs: Not on file   Physical Activity: Not on file   Stress: Not on file   Social Connections: Not on file   Intimate Partner Violence: Not on file   Housing Stability: Not on file       Occasionally uses marijuana.  Denies any other recreational  drug use.  Family History   No family history on file.    Current Medications     No current facility-administered medications for this encounter.     No current outpatient medications on file.       Allergies   No Known Allergies    Physical Exam   Patient Vitals for the past 24 hrs:   Temp Pulse Resp BP SpO2   01/02/23 2119 98.8 F (37.1 C) 80 17 (!) 145/77 99 %     Constitutional: Well developed, well nourished.   EYES: Conjuctivae clear, lids normal.  PERRLA.  ENT: EARS: Canals and TM's are clear bilaterally. MOUTH/THROAT: Oral mucosa is pink, moist and without lesions. Uvula midline. Posterior pharynx normal. Airway patent. No stridor.   TEETH: Right lower first molar is fractured and decaying.  Tooth nontender to palpation.  Adjacent gingiva no swelling, erythema, tenderness, or fluctuance; no periapical abscess.  NECK: Soft, supple. No submandibular masses  RESP: Clear and equal BS. No wheezing, rhonchi, crackles, or rales. No respiratory distress, tachypnea, or accessory muscle use.  CV:  Regular rate and rhythm.   VASCULAR: Calfs soft and nontender. No peripheral edema. DP pulses 2+ and equal bilaterally.  GI:  Abdomen soft, nontender to palpation.   MSK: Tenderness to anterior superior aspect of right chest wall, pain exacerbated with movement.  Tenderness to right cervical and thoracic muscles, pain exacerbated with movement.  No vertebral tenderness.  No bony step-offs, ecchymosis, ears or/swelling or deformities.  SKIN: Warm and dry without rashes.  NEURO: Alert, oriented x 3.     Impression and Management Plan   Patient presents complaining of right-sided chest, neck, and upper back pain.  Symptoms consistent with muscle strain.  Lower suspicion for cardiopulmonary etiologies.  I also do not think this is related to his dental issues. He is not having any dental pain at this time and on exam there are no signs/symptoms of dental abscess or deep neck space infection.  I discussed this at length with patient but he expressed a lot of concerns so I offered a screening EKG and chest x-ray.  I do not think any additional ancillary studies are necessary.  Patient was happy and agreeable with this plan.    Diagnostic Studies   Lab:   Results for orders placed or performed during the hospital encounter of 01/02/23   XR CHEST (2 VW)    Narrative    Examination:  Frontal and lateral views of the chest.     INDICATION: Chest Pain    COMPARISON: None    WORKSTATION ID: [id number]    FINDINGS: No alveolar consolidations, congestive changes or pleural effusions.  Cardiomediastinal silhouette normal in size and contour.      Impression    IMPRESSION: No acute cardiopulmonary process.     Electronically signed by: Mikal Plane, MD 01/03/2023 1:17 AM EDT            Workstation ID: [id number]         EKG Interpretation: My personal interpretation of the EKG, which was reviewed by Dr. Leretha Pol, is: Sinus bradycardia, no acute S-T segment or T-wave abnormalities that are consistent with acute ischemia or  infarction.  Vent rate: 59 bpm  PR interval: 140 ms  QRS duration: 84 ms  QT/QTc: 404/399 ms    Imaging: My personal interpretation of the CXR is: No pneumothorax, no infiltrate.    ED Course/MDM       Medications   methocarbamol (ROBAXIN)  tablet 1,500 mg (1,500 mg Oral Given 01/03/23 0108)   ibuprofen (ADVIL;MOTRIN) tablet 600 mg (600 mg Oral Given 01/03/23 0108)       Threat to body function without evaluation and management: Musculoskeletal    SOCIAL DETERMINANTS  impacting Evaluation and Management: Income, medical bills, transportation, support systems, health coverage, provider availability.     Comorbidities impacting Evaluation and Management: Hx of dental pain    NARRATIVE:  Chest x-ray no acute cardiopulmonary process.  EKG sinus bradycardia rate 59 bpm, no acute ischemic changes.    He was treated symptomatically as listed above.    Given ibuprofen and Robaxin prescriptions.  Instructed to advance activity as tolerated and avoid heavy lifting.  Follow-up with PCP in 1 week for recheck.  Follow-up with a dentist for definitive dental care.  Return to ED immediately for new or worsening symptoms    Final Diagnosis       ICD-10-CM    1. Chest pain, musculoskeletal  R07.89       2. Strain of neck muscle, initial encounter  S16.1XXA       3. Thoracic myofascial strain, initial encounter  S29.019A           Disposition   Home in stable condition.     Ahmed Prima, PA-C  January 03, 2023    The patient was fully discussed with attending Loa Socks, MD who agrees with the above assessment and plan.    Nursing notes have been reviewed by the physician/advanced practice Clinician.    Dragon medical dictation was used for portions of this report. Unintended grammatical errors may occur.    My signature above authenticates this document and my orders, the final diagnosis (es), discharge prescription (s), and instructions in the Epic record. If you have any questions please contact [phone].     Antony Salmon, Jill Side, PA-C  01/03/23 0500

## 2023-01-03 ENCOUNTER — Emergency Department: Admit: 2023-01-03 | Payer: PRIVATE HEALTH INSURANCE

## 2023-01-03 ENCOUNTER — Inpatient Hospital Stay
Admit: 2023-01-03 | Discharge: 2023-01-03 | Disposition: A | Discharge status: Home / Self Care | Payer: PRIVATE HEALTH INSURANCE | Attending: Emergency Medicine

## 2023-01-03 LAB — EKG 12-LEAD
Atrial Rate: 59 {beats}/min
Calculated P Axis: 68 degrees
Calculated R Axis: 69 degrees
Calculated T Axis: 50 degrees
P-R Interval: 140 ms
Q-T Interval: 404 ms
QRS Duration: 84 ms
QTC Calculation (Bezet): 399 ms
Ventricular Rate: 59 {beats}/min

## 2023-01-03 MED ORDER — IBUPROFEN 600 MG PO TABS
600 | ORAL | Status: AC
Start: 2023-01-03 — End: 2023-01-03
  Administered 2023-01-03: 05:00:00 600 mg via ORAL

## 2023-01-03 MED ORDER — METHOCARBAMOL 750 MG PO TABS
750 MG | ORAL_TABLET | Freq: Four times a day (QID) | ORAL | 0 refills | Status: AC
Start: 2023-01-03 — End: ?

## 2023-01-03 MED ORDER — IBUPROFEN 800 MG PO TABS
800 MG | ORAL_TABLET | Freq: Three times a day (TID) | ORAL | 0 refills | Status: AC | PRN
Start: 2023-01-03 — End: ?

## 2023-01-03 MED ORDER — METHOCARBAMOL 750 MG PO TABS
750 | ORAL | Status: AC
Start: 2023-01-03 — End: 2023-01-03
  Administered 2023-01-03: 05:00:00 1500 mg via ORAL

## 2023-01-03 MED FILL — IBUPROFEN 600 MG PO TABS: 600 MG | ORAL | Qty: 1

## 2023-01-03 MED FILL — METHOCARBAMOL 750 MG PO TABS: 750 MG | ORAL | Qty: 2

## 2023-01-03 NOTE — Discharge Instructions (Addendum)
-  Advance activity as tolerated. Avoid heavy lifting.  -Take Ibuprofen as needed for pain and inflammation.  -Take methocarbamol, Robaxin, as needed for muscle spasms.  -Follow up with primary care physician within 1 week for re-check.  -Return to the ER for new or worsening symptoms.

## 2023-01-03 NOTE — ED Notes (Signed)
Discharge instructions reviewed with pt. pt verbalized understanding.       Earl Lites, RN  01/03/23 [phone]
